# Patient Record
Sex: Male | Born: 1970 | Race: Black or African American | Hispanic: No | Marital: Married | Smoking: Never smoker
Health system: Southern US, Community
[De-identification: ages and names within clinical notes are randomized; demographics above are authoritative.]

## PROBLEM LIST (undated history)

## (undated) HISTORY — PX: HAND SURGERY: SHX662

## (undated) HISTORY — PX: ANKLE SURGERY: SHX546

## (undated) HISTORY — PX: BACK SURGERY: SHX140

---

## 2007-11-09 ENCOUNTER — Emergency Department (HOSPITAL_COMMUNITY): Admission: EM | Admit: 2007-11-09 | Discharge: 2007-11-09 | Payer: Self-pay | Admitting: Emergency Medicine

## 2007-11-17 ENCOUNTER — Encounter: Admission: RE | Admit: 2007-11-17 | Discharge: 2007-11-17 | Payer: Self-pay | Admitting: Orthopedic Surgery

## 2007-12-08 ENCOUNTER — Encounter: Admission: RE | Admit: 2007-12-08 | Discharge: 2007-12-08 | Payer: Self-pay | Admitting: Orthopedic Surgery

## 2009-11-24 IMAGING — CR DG HAND COMPLETE 3+V*R*
3 series · 3 of 3 positions shown · non-contrast
Comparison: none

HISTORY: Fell at work, pain at fourth metacarpal and wrist

RIGHT HAND 3 VIEWS:
Dorsal plate and 4 screws at healed third metacarpal shaft fracture.
Bone mineralization normal.
Joint spaces preserved.
No acute fracture, dislocation, or bone destruction.

[x hand pa right]
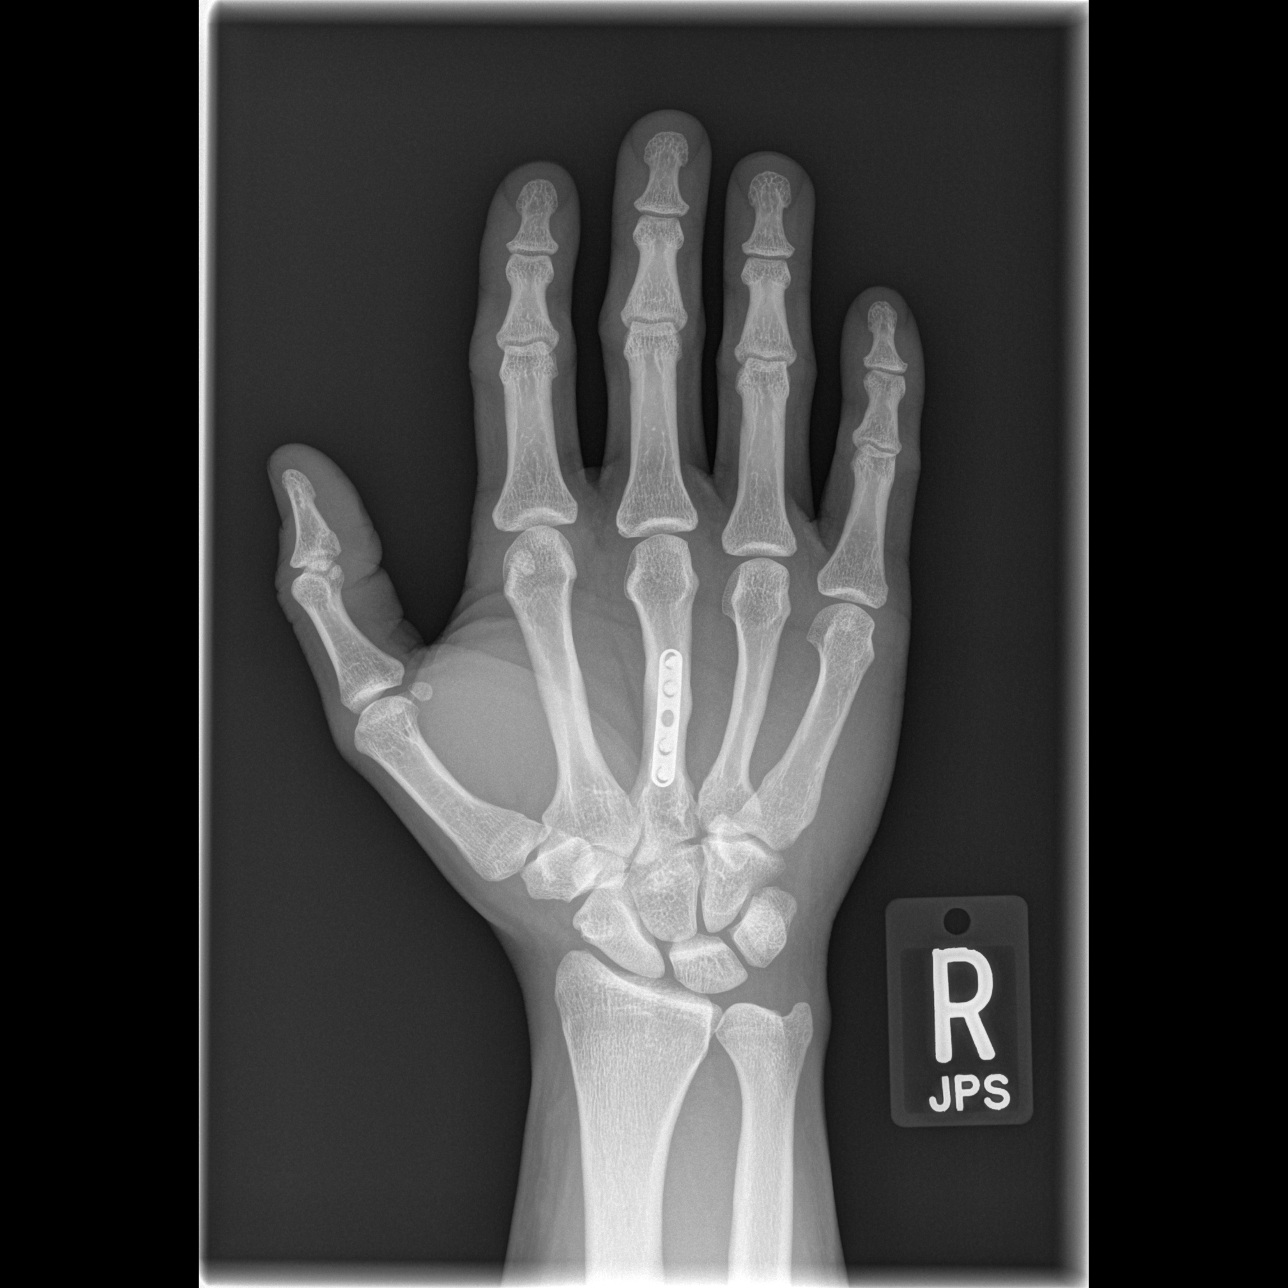

[x hand oblique right]
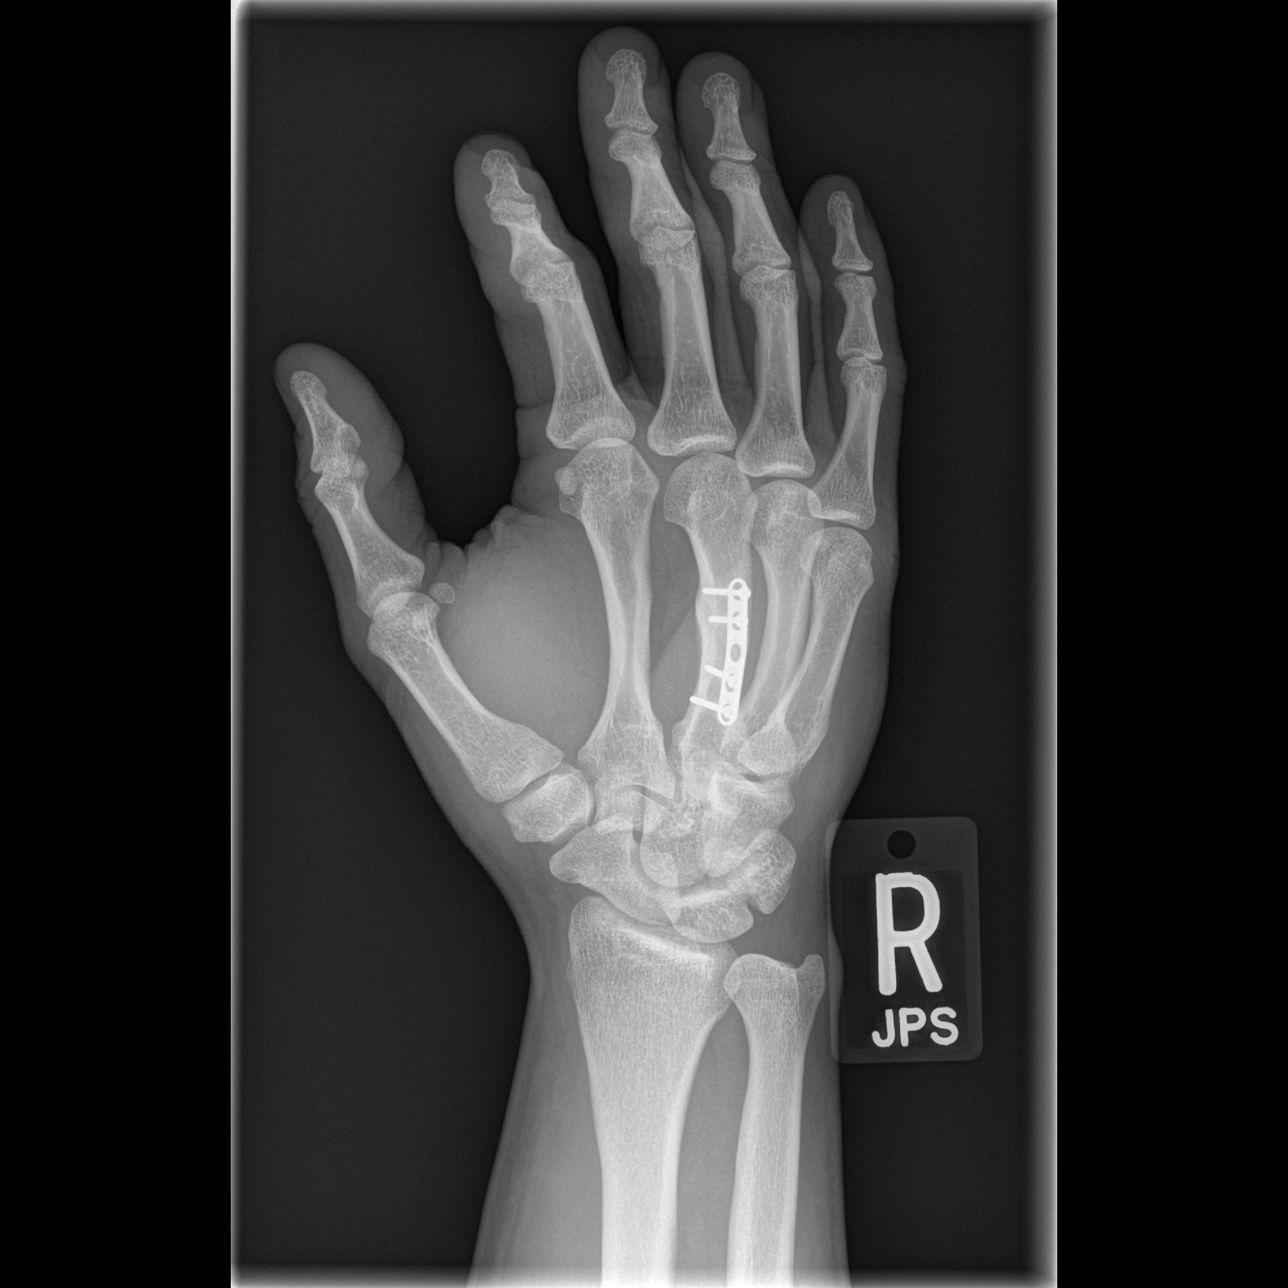

[x hand lat right]
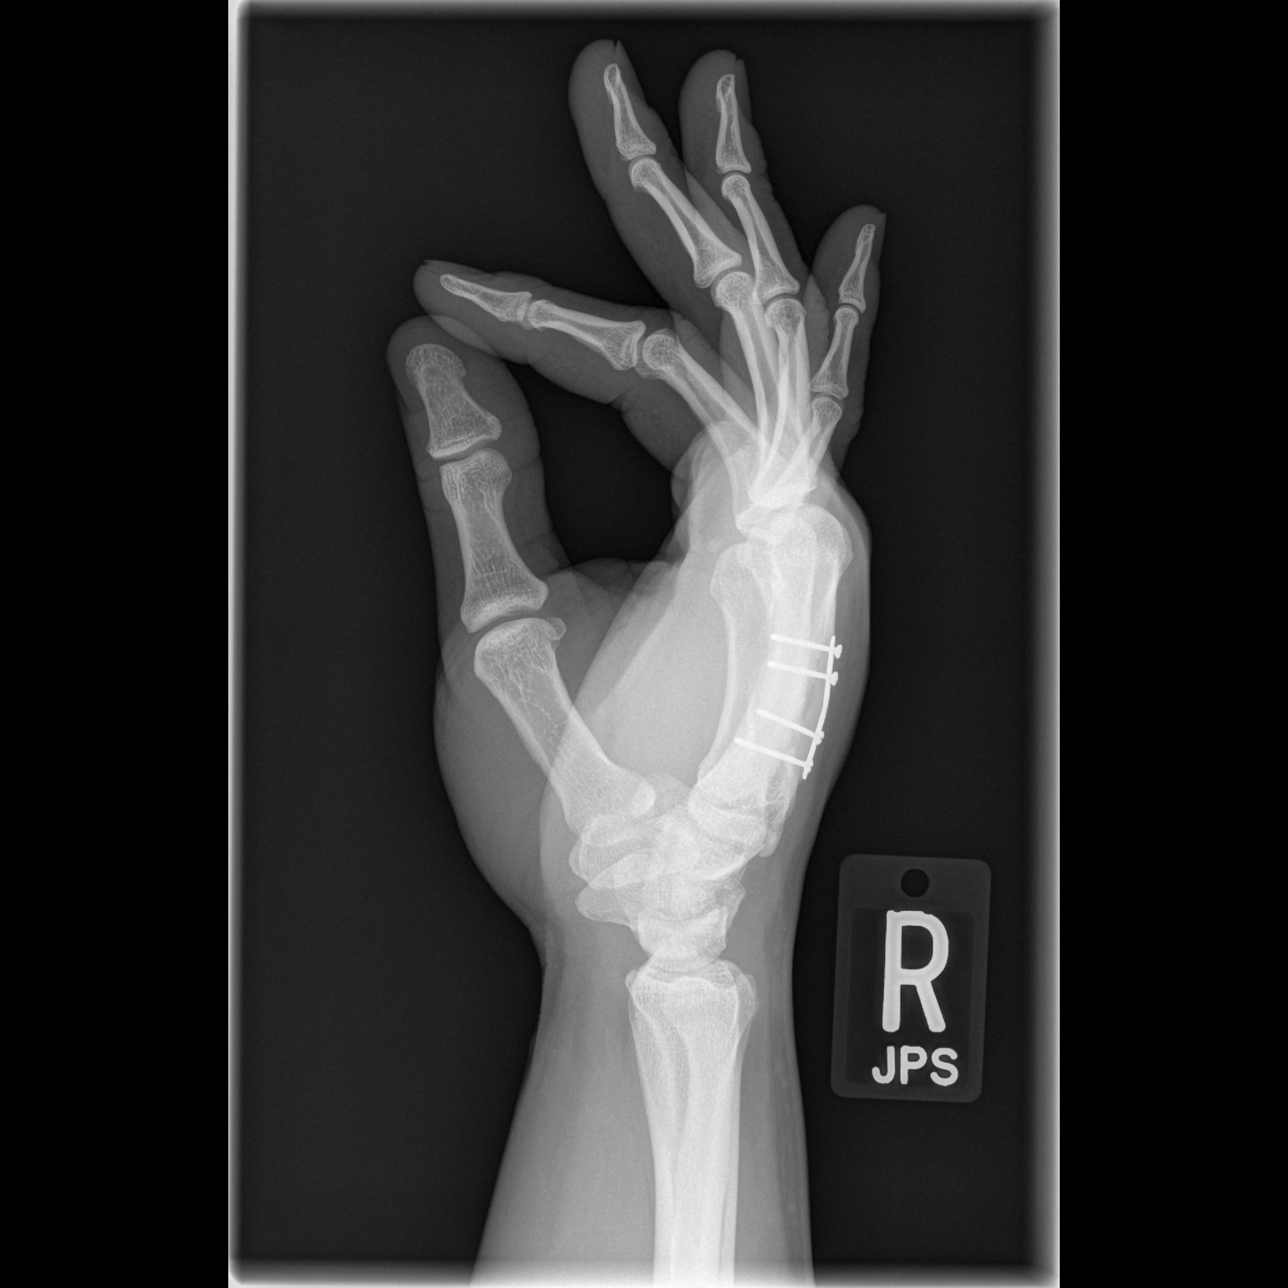

[3 of 3 positions shown; findings below may reference images not displayed]

IMPRESSION: No acute bony abnormalities.
Old healed third metacarpal shaft fracture.

## 2009-12-23 IMAGING — CR DG HAND 2V*R*
2 series · 2 of 2 positions shown · non-contrast
Comparison: 11/17/2007

CLINICAL DATA: ORIF for hand fracture

RIGHT HAND - 2 VIEW

[view not recorded (1 of 2)]
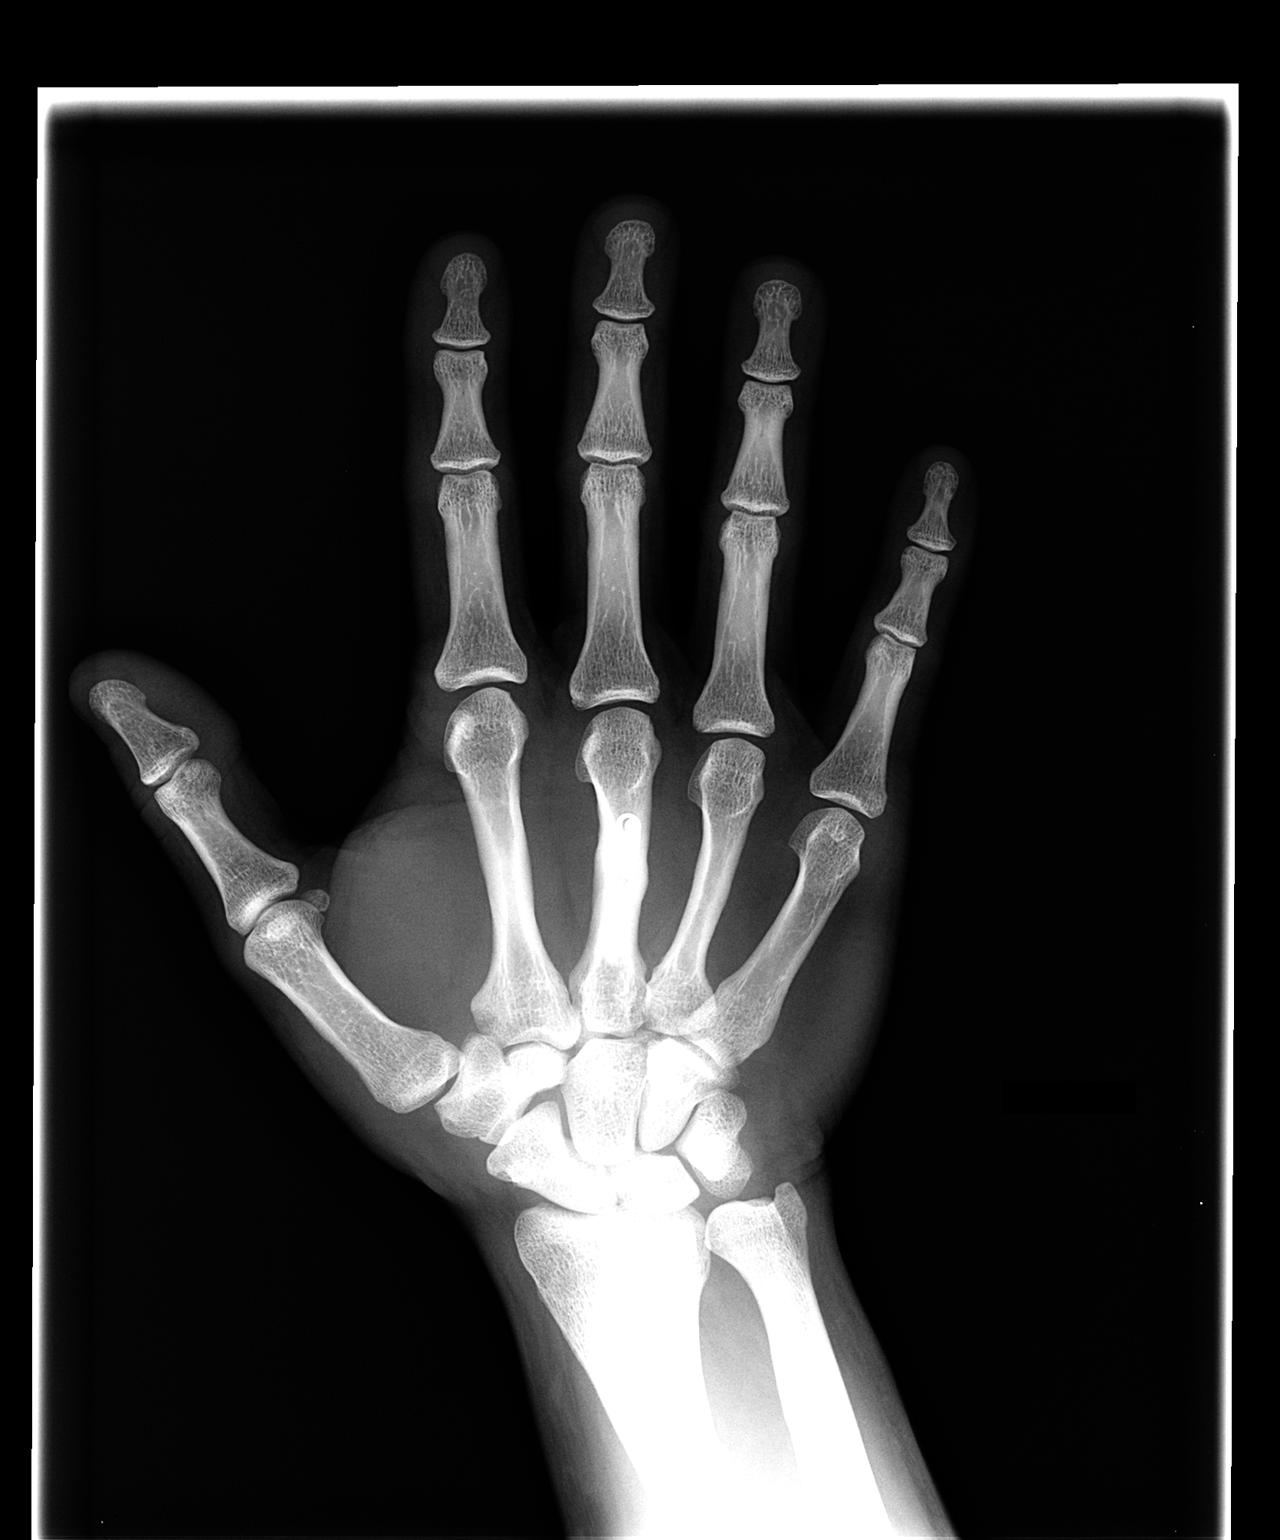

[view not recorded (2 of 2)]
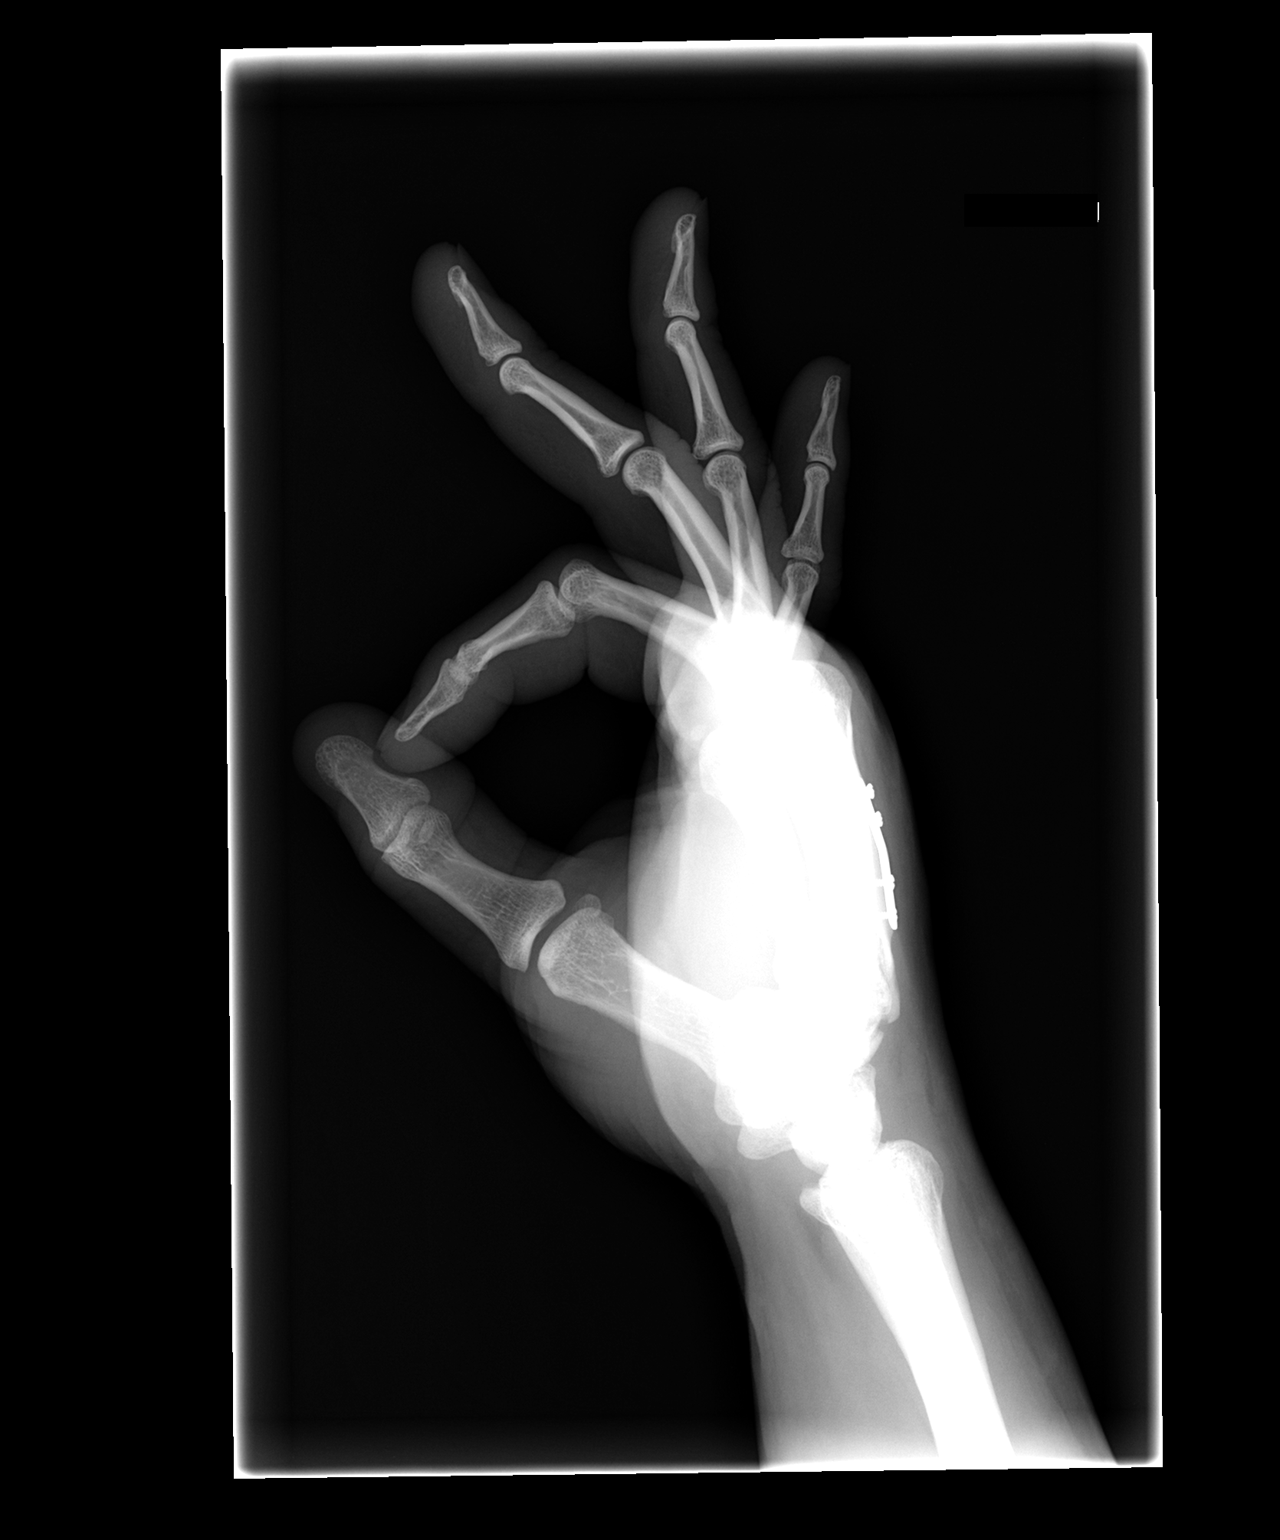

[2 of 2 positions shown; findings below may reference images not displayed]

FINDINGS: The patient is status post ORIF for middle metacarpal
fracture.  Appearance is stable.  No evidence for hardware
complications.
IMPRESSION: Stable exam.  Postsurgical change in the middle metacarpal without
complicating features.

## 2011-04-27 ENCOUNTER — Ambulatory Visit (INDEPENDENT_AMBULATORY_CARE_PROVIDER_SITE_OTHER): Payer: Self-pay | Admitting: General Surgery

## 2014-01-16 ENCOUNTER — Encounter (HOSPITAL_COMMUNITY): Payer: Self-pay | Admitting: Emergency Medicine

## 2014-01-16 ENCOUNTER — Emergency Department (HOSPITAL_COMMUNITY)
Admission: EM | Admit: 2014-01-16 | Discharge: 2014-01-16 | Disposition: A | Discharge status: Home / Self Care | Payer: Worker's Compensation | Attending: Emergency Medicine | Admitting: Emergency Medicine

## 2014-01-16 DIAGNOSIS — W208XXA Other cause of strike by thrown, projected or falling object, initial encounter: Secondary | ICD-10-CM | POA: Insufficient documentation

## 2014-01-16 DIAGNOSIS — Y990 Civilian activity done for income or pay: Secondary | ICD-10-CM | POA: Insufficient documentation

## 2014-01-16 DIAGNOSIS — Y9289 Other specified places as the place of occurrence of the external cause: Secondary | ICD-10-CM | POA: Insufficient documentation

## 2014-01-16 DIAGNOSIS — Y9389 Activity, other specified: Secondary | ICD-10-CM | POA: Insufficient documentation

## 2014-01-16 DIAGNOSIS — S0003XA Contusion of scalp, initial encounter: Secondary | ICD-10-CM | POA: Insufficient documentation

## 2014-01-16 DIAGNOSIS — S1093XA Contusion of unspecified part of neck, initial encounter: Principal | ICD-10-CM

## 2014-01-16 DIAGNOSIS — S0083XA Contusion of other part of head, initial encounter: Secondary | ICD-10-CM | POA: Insufficient documentation

## 2014-01-16 NOTE — ED Provider Notes (Signed)
CSN: [id number]     Arrival date & time 01/16/14  1952 History   First MD Initiated Contact with Patient 01/16/14 2156     Chief Complaint  Patient presents with  . Facial Pain   HPI This chart was scribed for non-physician practitioner working with No att. providers found, by Andrew Auaven Small, ED Scribe. This patient was seen in room TR10C/TR10C and the patient's care was started at 11:41 PM.  Jason PettiesMichael Stephens is a 43 y.o. male who presents to the Emergency Department complaining of facial pain. Pt reports that he was at work when he was unloading a roll of carpet when a customer tried to help him but accidentally fell  over and hit him in the face. Pt reports facial pain to touch. Pt reports HA at first but denies HA at this time. He reports left sided neck soreness. Pt was able to ambulate after the accident  He denies dental pain, eye pain, visual disturbances and nose pain. He denies nausea, LOC, and dizziness.  History reviewed. No pertinent past medical history. Past Surgical History  Procedure Laterality Date  . Ankle surgery    . Hand surgery    . Back surgery     No family history on file. History  Substance Use Topics  . Smoking status: Never Smoker   . Smokeless tobacco: Not on file  . Alcohol Use: Yes     Comment: occasionally    Review of Systems  Eyes: Negative for photophobia, pain and visual disturbance.  Gastrointestinal: Negative for nausea.  Musculoskeletal: Positive for neck pain. Negative for back pain and gait problem.  Neurological: Positive for headaches. Negative for dizziness and syncope.   Allergies  Review of patient's allergies indicates no known allergies.  Home Medications   Prior to Admission medications   Not on File   BP 120/82  Pulse 66  Temp(Src) 98.9 F (37.2 C) (Oral)  Resp 18  Ht 5\' 8"  (1.727 m)  Wt 276 lb (125.193 kg)  BMI 41.98 kg/m2  SpO2 100% Physical Exam  Nursing note and vitals reviewed. Constitutional: He is oriented to  person, place, and time. He appears well-developed and well-nourished. No distress.  HENT:  Head: Normocephalic and atraumatic.    Nose: Right sinus exhibits no maxillary sinus tenderness. Left sinus exhibits no maxillary sinus tenderness.  No bleeding in bilater nares No tenderness to nasal bridge.  He has two small abrasions. One 0.25 x 0.5 cm above upper lip on the left. It has already begun to scab and is only mildly tender. No dental or intraoral injuries noted.  Small 0.5 x 0.5 cm abrasion above left eyebrow. It has already begun to heal and is mildly tender. No crepitus or skull depressions noted.  Eyes: EOM are normal.  Neck: Neck supple.  No midline or paraspinal tenderness   Cardiovascular: Normal rate.   Pulmonary/Chest: Effort normal. No respiratory distress.  Musculoskeletal: Normal range of motion.  No associated crepitus or skull depressions  Neurological: He is alert and oriented to person, place, and time. He displays no atrophy and no tremor. No cranial nerve deficit or sensory deficit. He exhibits normal muscle tone. He displays no seizure activity. Coordination and gait normal.  Skin: Skin is warm and dry.  Contusion to left eyebrow  Psychiatric: He has a normal mood and affect. His behavior is normal.    ED Course  Procedures  11:41 PM-Discussed treatment plan which includes rest. Pt has been advised to take a day  off of work.   Labs Review Labs Reviewed - No data to display  Imaging Review No results found.   EKG Interpretation None      MDM   Final diagnoses:  Facial contusion     Patient has two small abrasions. NO bony abnormalities. He came to the ER because he wanted to be "checked out just in case". He denies wanting anything for pain and denies any neurological symptoms.  43 y.o.Jason NeedleMichael Bogie's  with head injury. No neurological deficits and normal neuro exam. Patient can walk without gait abnormality. No loss of bowel or bladder  control.  RICE protocol and pain medicine indicated and discussed with patient.   Patient Plan 1. Medications:  Usual home medications, Tylenol or Motrin 2. Treatment: rest, drink plenty of fluids, ice  3. Follow Up: Please followup with your primary doctor for discussion of your diagnoses and further evaluation after today's visit; if you do not have a primary care doctor use the resource guide provided to find one   Vital signs are stable at discharge. Filed Vitals:   01/16/14 2213  BP: 120/82  Pulse: 66  Temp: 98.9 F (37.2 C)  Resp: 18    Patient/guardian has voiced understanding and agreed to follow-up with the PCP or specialist.     I personally performed the services described in this documentation, which was scribed in my presence. The recorded information has been reviewed and is accurate.     Dorthula Matasiffany G Demetre Monaco, PA-C 01/17/14 2347

## 2014-01-16 NOTE — Discharge Instructions (Signed)
Facial or Scalp Contusion  A facial or scalp contusion is a deep bruise on the face or head. Injuries to the face and head generally cause a lot of swelling, especially around the eyes. Contusions are the result of an injury that caused bleeding under the skin. The contusion may turn blue, purple, or yellow. Minor injuries will give you a painless contusion, but more severe contusions may stay painful and swollen for a few weeks.   CAUSES   A facial or scalp contusion is caused by a blunt injury or trauma to the face or head area.   SIGNS AND SYMPTOMS   · Swelling of the injured area.    · Discoloration of the injured area.    · Tenderness, soreness, or pain in the injured area.    DIAGNOSIS   The diagnosis can be made by taking a medical history and doing a physical exam. An X-ray exam, CT scan, or MRI may be needed to determine if there are any associated injuries, such as broken bones (fractures).  TREATMENT   Often, the best treatment for a facial or scalp contusion is applying cold compresses to the injured area. Over-the-counter medicines may also be recommended for pain control.   HOME CARE INSTRUCTIONS   · Only take over-the-counter or prescription medicines as directed by your health care provider.    · Apply ice to the injured area.    · Put ice in a plastic bag.    · Place a towel between your skin and the bag.    · Leave the ice on for 20 minutes, 2 3 times a day.    SEEK MEDICAL CARE IF:  · You have bite problems.    · You have pain with chewing.    · You are concerned about facial defects.  SEEK IMMEDIATE MEDICAL CARE IF:  · You have severe pain or a headache that is not relieved by medicine.    · You have unusual sleepiness, confusion, or personality changes.    · You throw up (vomit).    · You have a persistent nosebleed.    · You have double vision or blurred vision.    · You have fluid drainage from your nose or ear.    · You have difficulty walking or using your arms or legs.    MAKE SURE YOU:    · Understand these instructions.  · Will watch your condition.  · Will get help right away if you are not doing well or get worse.  Document Released: 09/30/2004 Document Revised: 06/13/2013 Document Reviewed: 04/05/2013  [employer]® Patient Information ©2014 [employer].

## 2014-01-16 NOTE — ED Notes (Signed)
Pt states he was unloading a rack and it fell forward and hit him in the face.  Pt denies and dizziness or LOC.  Only complaint at this time is facial pain.

## 2014-01-20 NOTE — ED Provider Notes (Signed)
Medical screening examination/treatment/procedure(s) were performed by non-physician practitioner and as supervising physician I was immediately available for consultation/collaboration.   Alyx Mcguirk T Sharona Rovner, MD 01/20/14 0852
